# Patient Record
Sex: Male | Born: 1955 | Race: White | Hispanic: No | Marital: Married
Health system: Southern US, Community
[De-identification: ages and names within clinical notes are randomized; demographics above are authoritative.]

---

## 2019-06-18 ENCOUNTER — Ambulatory Visit: Payer: Self-pay | Admitting: Family Medicine

## 2019-08-27 ENCOUNTER — Other Ambulatory Visit: Payer: Self-pay

## 2019-08-27 ENCOUNTER — Ambulatory Visit: Payer: Managed Care, Other (non HMO) | Attending: Internal Medicine

## 2019-08-27 DIAGNOSIS — Z20822 Contact with and (suspected) exposure to covid-19: Secondary | ICD-10-CM

## 2019-08-28 ENCOUNTER — Other Ambulatory Visit: Payer: Self-pay

## 2019-08-28 ENCOUNTER — Ambulatory Visit (INDEPENDENT_AMBULATORY_CARE_PROVIDER_SITE_OTHER): Payer: Managed Care, Other (non HMO) | Admitting: Family

## 2019-08-28 DIAGNOSIS — U071 COVID-19: Secondary | ICD-10-CM

## 2019-08-28 LAB — NOVEL CORONAVIRUS, NAA: SARS-CoV-2, NAA: DETECTED — AB

## 2019-08-28 NOTE — Progress Notes (Signed)
Virtual Visit via Video Note  I connected with Hunter Garner on 08/28/19 at  5:00 PM EST by a video enabled telemedicine application and verified that I am speaking with the correct person using two identifiers.  Location: Patient: home Provider: work   I discussed the limitations of evaluation and management by telemedicine and the availability of in person appointments. The patient expressed understanding and agreed to proceed.  History of Present Illness:   Patient is a 64 yr old male who presents today to discuss a + covid-19 result 08/27/19.  Wife has been ill since last week but she tested negative for COVID-19 yesterday.  Patient reports that his symptoms began Sunday. Symptoms include fever, body ache, HA, nausea, dry mouth, loss of appetite, dry cough and fatigue. Has been spending a lot of time in bed the last two days per wife. He reports fever (100.3 several times).  He denies nasal congestion, SOB or chest pain. He reports that he has had a generalized burning sensation in his abdomen since he has been ill.   No past medical history on file.   Social History   Socioeconomic History  . Marital status: Married    Spouse name: Not on file  . Number of children: Not on file  . Years of education: Not on file  . Highest education level: Not on file  Occupational History  . Not on file  Tobacco Use  . Smoking status: Not on file  Substance and Sexual Activity  . Alcohol use: Not on file  . Drug use: Not on file  . Sexual activity: Not on file  Other Topics Concern  . Not on file  Social History Narrative  . Not on file   Social Determinants of Health   Financial Resource Strain:   . Difficulty of Paying Living Expenses: Not on file  Food Insecurity:   . Worried About Programme researcher, broadcasting/film/video in the Last Year: Not on file  . Ran Out of Food in the Last Year: Not on file  Transportation Needs:   . Lack of Transportation (Medical): Not on file  . Lack of  Transportation (Non-Medical): Not on file  Physical Activity:   . Days of Exercise per Week: Not on file  . Minutes of Exercise per Session: Not on file  Stress:   . Feeling of Stress : Not on file  Social Connections:   . Frequency of Communication with Friends and Family: Not on file  . Frequency of Social Gatherings with Friends and Family: Not on file  . Attends Religious Services: Not on file  . Active Member of Clubs or Organizations: Not on file  . Attends Banker Meetings: Not on file  . Marital Status: Not on file  Intimate Partner Violence:   . Fear of Current or Ex-Partner: Not on file  . Emotionally Abused: Not on file  . Physically Abused: Not on file  . Sexually Abused: Not on file   No family history on file.  Not on File  No current outpatient medications on file prior to visit.   No current facility-administered medications on file prior to visit.    There were no vitals taken for this visit.     Observations/Objective:   Gen: Awake, alert, no acute distress Resp: Breathing is even and non-labored Psych: calm/pleasant demeanor Neuro: Alert and Oriented x 3, + facial symmetry, speech is clear.   Assessment and Plan:  1) COVID-19 infection- Advised of CDC guidelines  for self isolation/ ending isolation. Pt advised to remain in self quarantine until at least 10 days since symptom onset and 3 consecutive days fever free without antipyretics And improvement in respiratory symptoms. Patient advised to utilize over the counter medications to treat symptoms. Pt advised to seek treatment in the ED if respiratory issues/distress develops or unrelieved chest pain. Pt advised they should only leave home to seek medical care and must wear a mask in public.  Pt advised to practice social distancing and to continue to use good preventative care measures. Encouraged to monitor for any worsening symptoms; watch for increased shortness of breath, weakness, and  signs of dehydration. Advised when to seek emergency care.  Instructed to rest and hydrate well.  Advised to leave the house during recommended isolation period, only if it is necessary to seek medical care.  20 minutes spent on today's visit.   Follow Up Instructions:    I discussed the assessment and treatment plan with the patient. The patient was provided an opportunity to ask questions and all were answered. The patient agreed with the plan and demonstrated an understanding of the instructions.   The patient was advised to call back or seek an in-person evaluation if the symptoms worsen or if the condition fails to improve as anticipated.  Nance Pear, NP

## 2019-09-26 ENCOUNTER — Ambulatory Visit: Payer: Managed Care, Other (non HMO) | Admitting: Family

## 2019-11-23 ENCOUNTER — Ambulatory Visit: Payer: Managed Care, Other (non HMO) | Admitting: Family

## 2020-08-07 ENCOUNTER — Telehealth: Payer: Self-pay | Admitting: General Practice

## 2020-08-07 NOTE — Telephone Encounter (Signed)
OK 

## 2020-08-07 NOTE — Telephone Encounter (Signed)
Patient is requesting to establish care with you, looks like he was seen by you back in January 2021 for an acute visit but did not return for new patient appointment.   Please advise if ok to schedule

## 2020-08-08 ENCOUNTER — Ambulatory Visit (HOSPITAL_BASED_OUTPATIENT_CLINIC_OR_DEPARTMENT_OTHER)
Admission: RE | Admit: 2020-08-08 | Discharge: 2020-08-08 | Disposition: A | Discharge status: Home / Self Care | Payer: Managed Care, Other (non HMO) | Source: Ambulatory Visit | Attending: Medical | Admitting: Medical

## 2020-08-08 ENCOUNTER — Ambulatory Visit: Payer: Managed Care, Other (non HMO) | Admitting: Medical

## 2020-08-08 ENCOUNTER — Other Ambulatory Visit: Payer: Self-pay

## 2020-08-08 VITALS — BP 114/72 | HR 82 | Temp 97.7°F | Resp 16 | Ht 67.0 in | Wt 181.2 lb

## 2020-08-08 DIAGNOSIS — M898X6 Other specified disorders of bone, lower leg: Secondary | ICD-10-CM

## 2020-08-08 NOTE — Patient Instructions (Addendum)
For rt tibia pain will get xray of the tibia/fibula. Will give you 2 ace wraps to apply to areas/compress.  Can use combination of low dose ibuprofen and tylenol for pain.  If pain location changes such as calf or behind knee pain let us know. In that case would order [place].   If pain persists into next week then would refer to sports med.  Follow up in 7-10 days or as needed

## 2020-08-08 NOTE — Progress Notes (Signed)
   Subjective:    Patient ID: Hunter Garner, male    DOB: 1956-02-10, 65 y.o.   MRN: [id number]  HPI  Pt in with some pain in his left pretibial area since tuesday. He states pushing a freezer and lifting freezer. He thinks maybe got a stress fracture. Later when walking he started to note distal pretibal pain walking. No pain in back of leg calf or popliteal area. No sob noted.  Pt states he works at [employer]. He enjoys his job. He describes his health is good with no problems in past.    Review of Systems  Constitutional: Negative for chills, fatigue and fever.  Respiratory: Negative for chest tightness, shortness of breath and wheezing.   Cardiovascular: Negative for chest pain and palpitations.  Gastrointestinal: Negative for abdominal pain, constipation and diarrhea.  Musculoskeletal: Negative for back pain and neck pain.       Rt tibia pain. See hpi and exam.  Skin: Negative for rash.    No past medical history on file.   Social History   Socioeconomic History  . Marital status: Married    Spouse name: Not on file  . Number of children: Not on file  . Years of education: Not on file  . Highest education level: Not on file  Occupational History  . Not on file  Tobacco Use  . Smoking status: Not on file  . Smokeless tobacco: Not on file  Substance and Sexual Activity  . Alcohol use: Not on file  . Drug use: Not on file  . Sexual activity: Not on file  Other Topics Concern  . Not on file  Social History Narrative  . Not on file   Social Determinants of Health   Financial Resource Strain: Not on file  Food Insecurity: Not on file  Transportation Needs: Not on file  Physical Activity: Not on file  Stress: Not on file  Social Connections: Not on file  Intimate Partner Violence: Not on file     No family history on file.  Not on File  No current outpatient medications on file prior to visit.   No current facility-administered medications on file prior to  visit.    BP 114/72   Pulse 82   Temp 97.7 F (36.5 C) (Oral)   Resp 16   Ht 5\' 7"  (1.702 m)   Wt 181 lb 3.2 oz (82.2 kg)   SpO2 99%   BMI 28.38 kg/m       Objective:   Physical Exam  General- No acute distress. Pleasant patient. Lungs- Clear, even and unlabored. Heart- regular rate and rhythm. Neurologic- CNII- XII grossly intact. Rt lower ext- distal 1/3 aspect of tibia tender small raised area over tibia. No redness, no warmth. No calf pain on palpation. negative homans sign. Left calf symmetric to rt.       Assessment & Plan:  For rt tibia pain will get xray of the tibia/fibula. Will give you 2 ace wraps to apply to areas/compress.  Can use combination of low dose ibuprofen and tylenol for pain.  If pain location changes such as calf or behind knee pain let know. In that case would order [place].   If pain persists into next week then would refer to sports med.  Follow up in 7-10 days or as needed  9-10, [employer]

## 2020-08-11 ENCOUNTER — Telehealth: Payer: Self-pay

## 2020-08-11 ENCOUNTER — Telehealth: Payer: Self-pay | Admitting: Medical

## 2020-08-11 DIAGNOSIS — M898X6 Other specified disorders of bone, lower leg: Secondary | ICD-10-CM

## 2020-08-11 NOTE — Telephone Encounter (Signed)
Patient called today and is asking for the referral to be placed to Sports Medicine for his leg he saw you for on Friday, 08/08/2020.

## 2020-08-11 NOTE — Telephone Encounter (Addendum)
Sports med referral placed. Notify pt.

## 2020-08-11 NOTE — Telephone Encounter (Signed)
Referral to sports med placed.

## 2020-08-12 NOTE — Telephone Encounter (Signed)
Pt called and lvm to notify pt about referral

## 2020-08-14 ENCOUNTER — Encounter: Payer: Self-pay | Admitting: Family Medicine

## 2020-08-14 ENCOUNTER — Ambulatory Visit: Payer: Self-pay

## 2020-08-14 ENCOUNTER — Other Ambulatory Visit: Payer: Self-pay

## 2020-08-14 ENCOUNTER — Ambulatory Visit (INDEPENDENT_AMBULATORY_CARE_PROVIDER_SITE_OTHER): Payer: BC Managed Care – PPO | Admitting: Family Medicine

## 2020-08-14 VITALS — BP 120/70 | Ht 67.0 in | Wt 180.0 lb

## 2020-08-14 DIAGNOSIS — M79604 Pain in right leg: Secondary | ICD-10-CM

## 2020-08-14 DIAGNOSIS — M84361A Stress fracture, right tibia, initial encounter for fracture: Secondary | ICD-10-CM | POA: Diagnosis not present

## 2020-08-14 NOTE — Patient Instructions (Signed)
Nice  to meet you Please try ice as needed  Please try to stay off the right leg   Please send me a message in MyChart with any questions or updates.  Please see me back in 4 weeks .   --Dr. Jordan Likes

## 2020-08-14 NOTE — Progress Notes (Signed)
  Hunter Garner - 65 y.o. male MRN [id number]  Date of birth: 1955/12/17  SUBJECTIVE:  Including CC & ROS.  No chief complaint on file.   Hunter Garner is a 65 y.o. male that is presenting with right leg pain.  The pain is started acutely over the past week.  Occurring at the distal tibia.  No injury or inciting event.  No history of stress fractures.  Pain can be severe is causing him to limp with ambulation..  Independent review of the tibia and fibula x-ray from 1/11 shows no acute changes.   Review of Systems See HPI   HISTORY: Past Medical, Surgical, Social, and Family History Reviewed & Updated per EMR.   Pertinent Historical Findings include:  History reviewed. No pertinent past medical history.  History reviewed. No pertinent surgical history.  History reviewed. No pertinent family history.  Social History   Socioeconomic History  . Marital status: Married    Spouse name: Not on file  . Number of children: Not on file  . Years of education: Not on file  . Highest education level: Not on file  Occupational History  . Not on file  Tobacco Use  . Smoking status: Not on file  . Smokeless tobacco: Not on file  Substance and Sexual Activity  . Alcohol use: Not on file  . Drug use: Not on file  . Sexual activity: Not on file  Other Topics Concern  . Not on file  Social History Narrative  . Not on file   Social Determinants of Health   Financial Resource Strain: Not on file  Food Insecurity: Not on file  Transportation Needs: Not on file  Physical Activity: Not on file  Stress: Not on file  Social Connections: Not on file  Intimate Partner Violence: Not on file     PHYSICAL EXAM:  VS: BP 120/70   Ht 5\' 7"  (1.702 m)   Wt 180 lb (81.6 kg)   BMI 28.19 kg/m  Physical Exam Gen: NAD, alert, cooperative with exam, well-appearing MSK:  Right leg: Tenderness to palpation over the anterior portion of the distal tibia. Mild overlying swelling. No  ecchymosis. Normal strength resistance. Neurovascular intact  Limited ultrasound: Right leg:  Increased hyperemia over the anterior and fibular portion of the tibia with no cortical defect appreciated of the tibia.  No changes of the musculature in the area but does have increased hyperemia  Summary: Findings would suggest a stress reaction of the tibia.  Ultrasound and interpretation by , MD    ASSESSMENT & PLAN:   Stress fracture of right tibia Symptoms suggest a stress reaction.  No prior history of stress reaction. -Counseled on home exercise therapy and supportive care. -Aircast. -Could consider insoles -Counseled on calcium vitamin D and vitamin K to

## 2020-08-15 DIAGNOSIS — M84361A Stress fracture, right tibia, initial encounter for fracture: Secondary | ICD-10-CM | POA: Insufficient documentation

## 2020-08-15 NOTE — Assessment & Plan Note (Signed)
Symptoms suggest a stress reaction.  No prior history of stress reaction. -Counseled on home exercise therapy and supportive care. -Aircast. -Could consider insoles -Counseled on calcium vitamin D and vitamin K to

## 2020-09-18 ENCOUNTER — Ambulatory Visit: Payer: BC Managed Care – PPO | Admitting: Family Medicine

## 2020-10-15 ENCOUNTER — Telehealth: Payer: Self-pay | Admitting: Family Medicine

## 2020-10-15 NOTE — Telephone Encounter (Signed)
Pt's wife called states for DOS 08/14/20 pt's Ins cvg went COBRA --Grp # diff from what is listed in chart per wife---Pls refile clm to [facility] w/New COBRA grp# [id number]  Instead of  Grp# [id number].  --advised pt's wife to call Pt. Accting dept w/ update & request for refiling corrected clm to ins for pmt of Svcs.  --glh

## 2021-06-22 IMAGING — DX DG TIBIA/FIBULA 2V*R*
4 series · 4 of 4 positions shown · non-contrast
Comparison: None.

CLINICAL DATA: Patient c/o right lower leg pain x 1 week. Pain in
distal tib/area proximal to the ankle. NKI No hx tibia pain swollen
area distal region.

EXAM:
RIGHT TIBIA AND FIBULA - 2 VIEW

[tibia ap (1 of 2)]
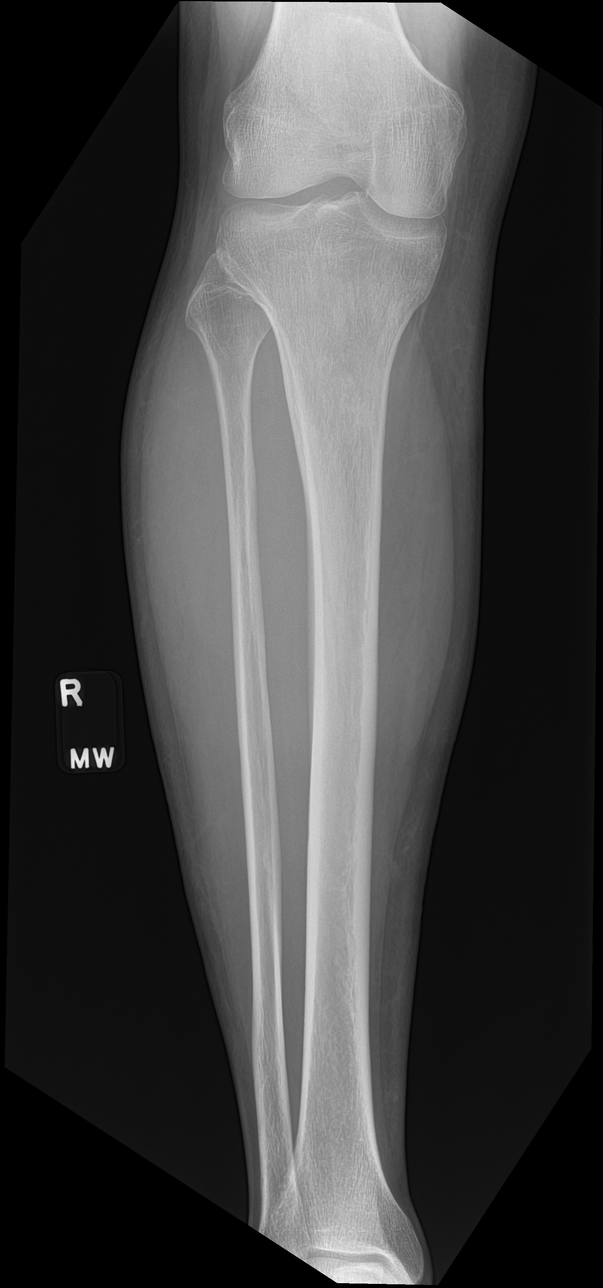

[tibia ap (2 of 2)]
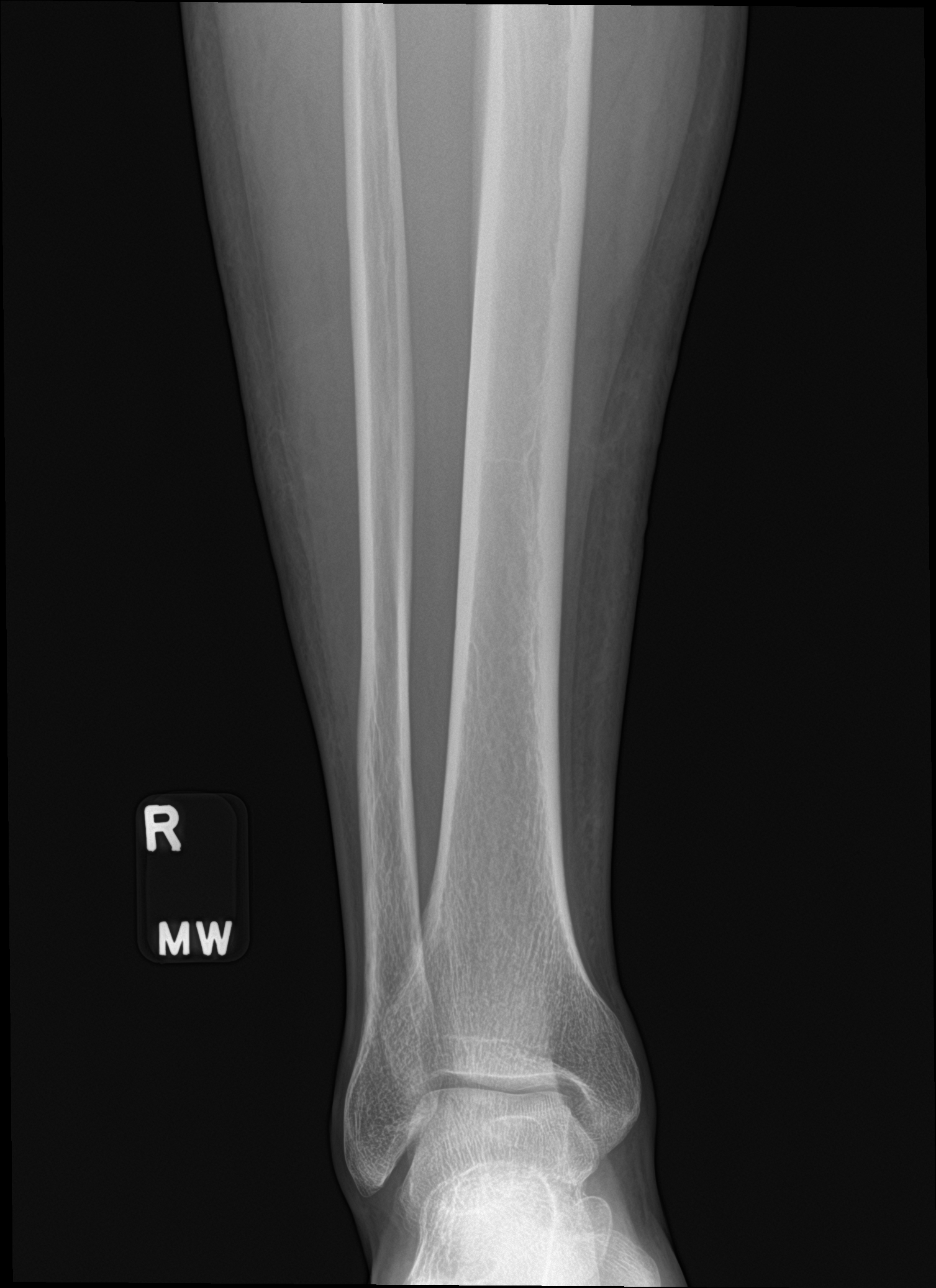

[tibia lat (1 of 2)]
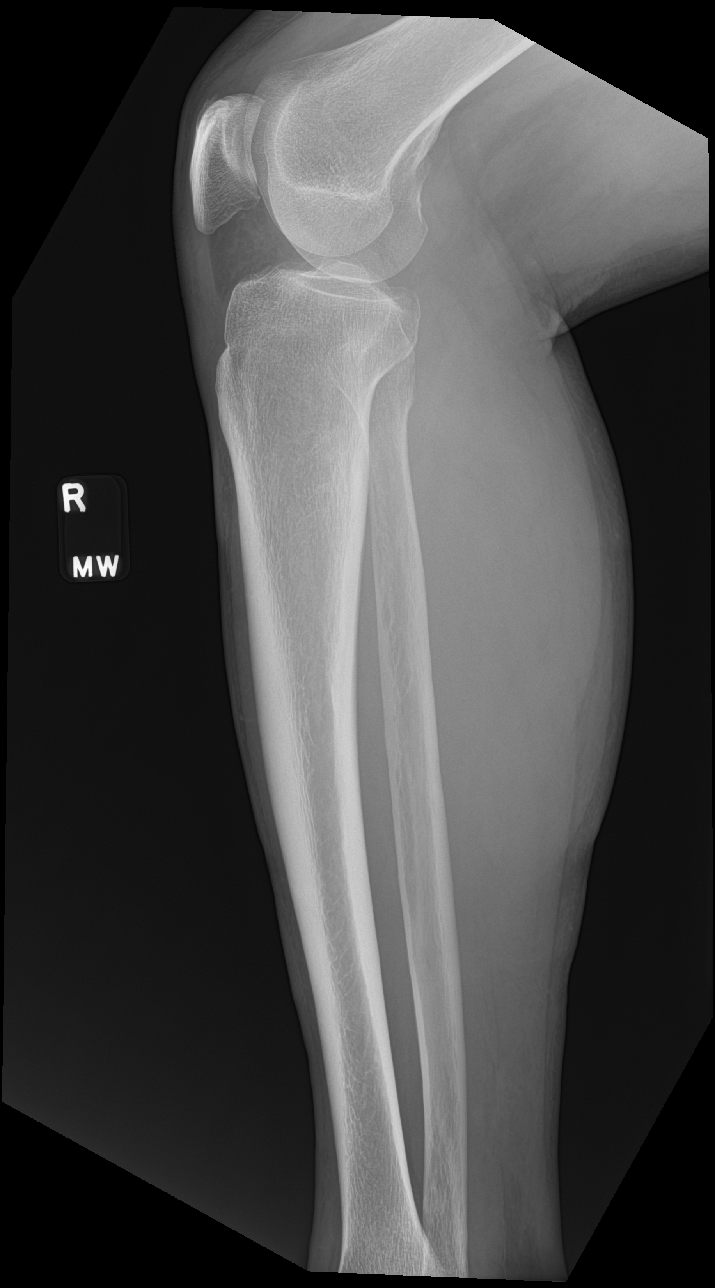

[tibia lat (2 of 2)]
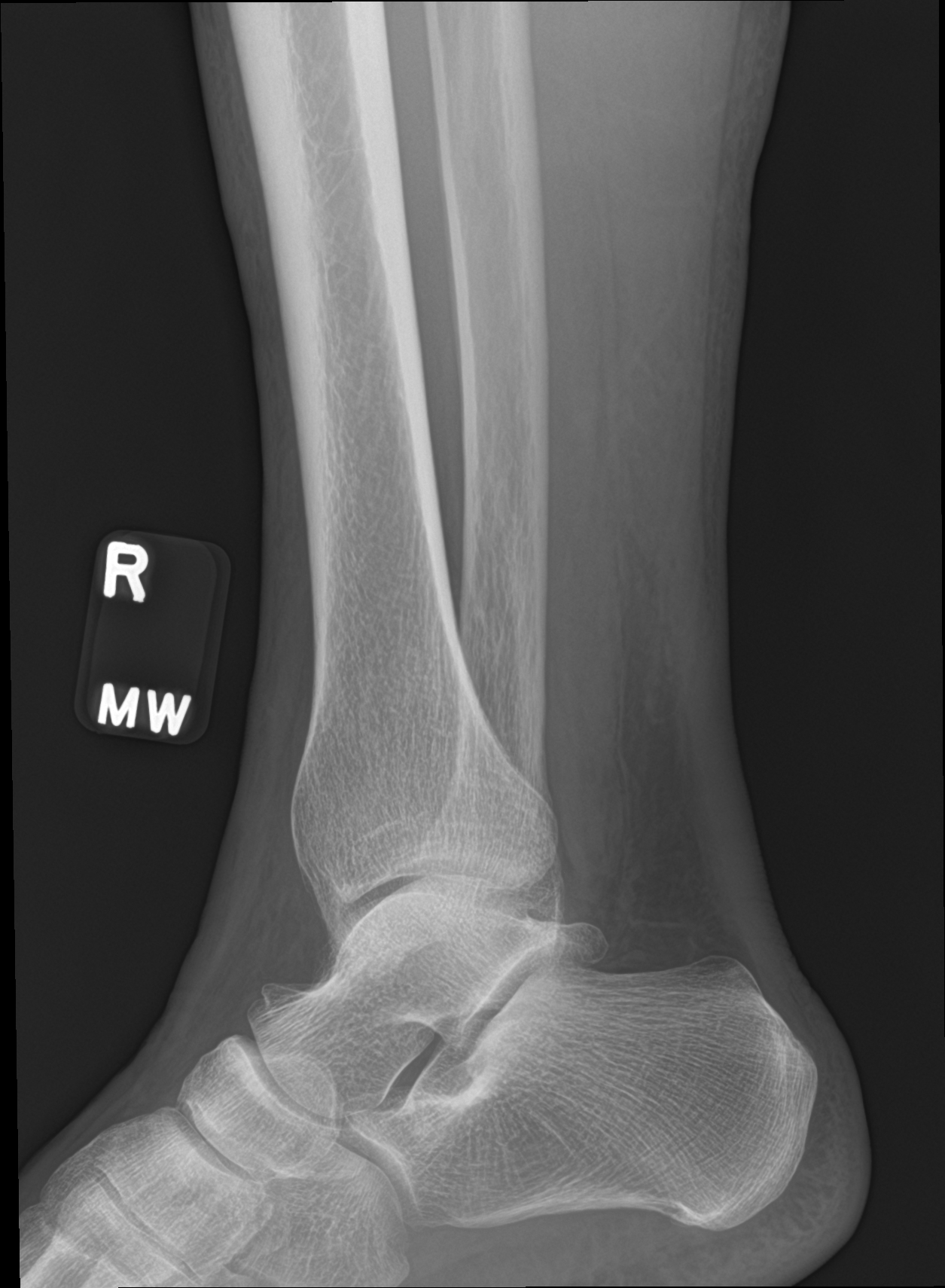

[4 of 4 positions shown; findings below may reference images not displayed]

FINDINGS: No fracture of the tibia or fibula. Knee joint and ankle joint
appear normal on two views. No soft tissue abnormality.
IMPRESSION: No acute osseous abnormality.

## 2022-11-15 ENCOUNTER — Encounter: Payer: Self-pay | Admitting: *Deleted
